# Patient Record
Sex: Male | Born: 2000 | Race: Black or African American | Hispanic: No | Marital: Single | Smoking: Never smoker
Health system: Southern US, Community
[De-identification: ages and names within clinical notes are randomized; demographics above are authoritative.]

## PROBLEM LIST (undated history)

## (undated) DIAGNOSIS — L509 Urticaria, unspecified: Secondary | ICD-10-CM

## (undated) DIAGNOSIS — J45909 Unspecified asthma, uncomplicated: Secondary | ICD-10-CM

## (undated) DIAGNOSIS — R51 Headache: Secondary | ICD-10-CM

## (undated) DIAGNOSIS — R569 Unspecified convulsions: Secondary | ICD-10-CM

## (undated) HISTORY — PX: CIRCUMCISION: SUR203

## (undated) HISTORY — DX: Urticaria, unspecified: L50.9

## (undated) HISTORY — DX: Unspecified convulsions: R56.9

## (undated) HISTORY — DX: Headache: R51

## (undated) HISTORY — DX: Unspecified asthma, uncomplicated: J45.909

---
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
## 2020-01-02 ENCOUNTER — Telehealth: Payer: Self-pay | Admitting: *Deleted

## 2020-01-02 NOTE — Telephone Encounter (Signed)
Spoke to mother and advised that patient has one dose on hand but will need MD appt for reappoval for Xolair after that

## 2020-03-11 ENCOUNTER — Telehealth: Payer: Self-pay | Admitting: *Deleted

## 2020-03-11 NOTE — Telephone Encounter (Signed)
Joe Obrien called wanting to get his xolair shot, we do not have it in the clinic, it will need to be ordered. Tammy please advise.

## 2020-03-12 NOTE — Telephone Encounter (Signed)
Spoke with mother and he was scheduled for an office visit with Thurston Hole.

## 2020-03-12 NOTE — Telephone Encounter (Signed)
Patient is inactive last injection July 2021 and has not been seen in clinic since Sept 2020.  Needs MD appt to restart therapy

## 2020-03-13 ENCOUNTER — Ambulatory Visit: Payer: Medicaid Other | Admitting: Family Medicine

## 2020-03-13 ENCOUNTER — Other Ambulatory Visit: Payer: Self-pay

## 2020-03-13 ENCOUNTER — Ambulatory Visit (INDEPENDENT_AMBULATORY_CARE_PROVIDER_SITE_OTHER): Payer: Medicaid Other | Admitting: *Deleted

## 2020-03-13 ENCOUNTER — Encounter: Payer: Self-pay | Admitting: Family Medicine

## 2020-03-13 VITALS — BP 94/70 | HR 76 | Temp 99.0°F | Resp 16 | Ht 72.0 in | Wt 131.6 lb

## 2020-03-13 DIAGNOSIS — H1013 Acute atopic conjunctivitis, bilateral: Secondary | ICD-10-CM

## 2020-03-13 DIAGNOSIS — L501 Idiopathic urticaria: Secondary | ICD-10-CM

## 2020-03-13 DIAGNOSIS — J309 Allergic rhinitis, unspecified: Secondary | ICD-10-CM

## 2020-03-13 DIAGNOSIS — J4540 Moderate persistent asthma, uncomplicated: Secondary | ICD-10-CM | POA: Diagnosis not present

## 2020-03-13 MED ORDER — FLUTICASONE PROPIONATE 50 MCG/ACT NA SUSP
2.0000 | Freq: Every day | NASAL | 5 refills | Status: DC | PRN
Start: 2020-03-13 — End: 2021-02-18

## 2020-03-13 MED ORDER — EPINEPHRINE 0.3 MG/0.3ML IJ SOAJ
INTRAMUSCULAR | 1 refills | Status: DC
Start: 2020-03-13 — End: 2021-02-18

## 2020-03-13 MED ORDER — ALBUTEROL SULFATE HFA 108 (90 BASE) MCG/ACT IN AERS: 2.0000 | INHALATION_SPRAY | RESPIRATORY_TRACT | 2 refills | Status: DC | PRN

## 2020-03-13 MED ORDER — FEXOFENADINE HCL 180 MG PO TABS
180.0000 mg | ORAL_TABLET | Freq: Every day | ORAL | 5 refills | Status: DC
Start: 2020-03-13 — End: 2022-10-18

## 2020-03-13 MED ORDER — OLOPATADINE HCL 0.2 % OP SOLN: 1.0000 [drp] | Freq: Every day | OPHTHALMIC | 5 refills | Status: AC | PRN

## 2020-03-13 MED ORDER — FLOVENT HFA 110 MCG/ACT IN AERO: 2.0000 | INHALATION_SPRAY | Freq: Two times a day (BID) | RESPIRATORY_TRACT | 5 refills | Status: DC

## 2020-03-13 NOTE — Progress Notes (Signed)
[address] [place] [address] Dept: [phone]  FOLLOW UP NOTE  Patient ID: Joe Obrien, male    DOB: 09/26/2000  Age: 19 y.o. MRN: [id number] Date of Office Visit: 03/13/2020  Assessment  Chief Complaint: Asthma  HPI Joe Obrien is a 19 year old male who presents to the clinic for follow-up visit.  He was last seen in this clinic on 12/26/2018 for evaluation of asthma, allergic rhinitis, allergic conjunctivitis, and chronic idiopathic urticaria.  In the interim he reports that he had Covid infection in September.  At today's visit he reports his asthma has been well controlled with no shortness of breath, cough, or wheeze with activity or rest.  He reports that he last used his albuterol 1 time about 3 months ago.  Allergic rhinitis is reported as well controlled with occasional nasal congestion.  He is not using an antihistamine or nasal steroid spray at this time.  Allergic conjunctivitis is reported as not well controlled with itchy, red eyes for which she is not currently using any medical intervention.  Chronic urticaria is reported as poorly controlled with itch and hives occurring daily.  He had his last Xolair injection in July.  He reports that while he was taking Xolair on a regular basis his chronic urticaria was well controlled.  His current medications are listed in the chart.   Drug Allergies:  Allergies  Allergen Reactions  . Amoxicillin Hives    Physical Exam: BP 94/70   Pulse 76   Temp 99 F (37.2 C) (Tympanic)   Resp 16   Ht 6' (1.829 m)   Wt 131 lb 9.6 oz (59.7 kg)   SpO2 98%   BMI 17.85 kg/m    Physical Exam Vitals reviewed.  Constitutional:      Appearance: Normal appearance.  HENT:     Head: Normocephalic and atraumatic.     Right Ear: Tympanic membrane normal.     Left Ear: Tympanic membrane normal.     Nose:     Comments: Bilateral nares edematous and pale with clear nasal drainage noted.  Pharynx normal.  Ears normal.  Eyes normal.     Mouth/Throat:     Pharynx: Oropharynx is clear.  Eyes:     Conjunctiva/sclera: Conjunctivae normal.  Cardiovascular:     Rate and Rhythm: Normal rate and regular rhythm.     Heart sounds: Normal heart sounds. No murmur heard.   Pulmonary:     Effort: Pulmonary effort is normal.     Breath sounds: Normal breath sounds.     Comments: Bilateral expiratory wheeze which cleared post bronchodilator therapy Musculoskeletal:        General: Normal range of motion.     Cervical back: Normal range of motion and neck supple.  Skin:    General: Skin is warm and dry.  Neurological:     Mental Status: He is alert and oriented to person, place, and time.  Psychiatric:        Mood and Affect: Mood normal.        Behavior: Behavior normal.        Thought Content: Thought content normal.        Judgment: Judgment normal.     Diagnostics: FVC 4.82, FEV1 4.58.  Predicted FVC 4.91, predicted FEV1 4.19.  Spirometry indicates normal ventilatory function.  Postbronchodilator FVC 5.17, FEV1 4.93.  Post bronchodilator therapy spirometry indicates normal ventilatory function with 8% improvement in FEV1.  Assessment and Plan: 1. Moderate persistent asthma, unspecified  whether complicated   2. Idiopathic urticaria   3. Allergic rhinitis, unspecified seasonality, unspecified trigger   4. Allergic conjunctivitis of both eyes     Meds ordered this encounter  Medications  . albuterol (PROAIR HFA) 108 (90 Base) MCG/ACT inhaler    Sig: Inhale 2 puffs into the lungs every 4 (four) hours as needed for wheezing or shortness of breath.    Dispense:  18 g    Refill:  2    One for home and one for school  . fluticasone (FLOVENT HFA) 110 MCG/ACT inhaler    Sig: Inhale 2 puffs into the lungs 2 (two) times daily.    Dispense:  1 each    Refill:  5  . fexofenadine (ALLEGRA) 180 MG tablet    Sig: Take 1 tablet (180 mg total) by mouth daily.    Dispense:  30 tablet    Refill:  5  . fluticasone (FLONASE) 50  MCG/ACT nasal spray    Sig: Place 2 sprays into both nostrils daily as needed.    Dispense:  16 g    Refill:  5  . Olopatadine HCl (PATADAY) 0.2 % SOLN    Sig: Place 1 drop into both eyes daily as needed.    Dispense:  2.5 mL    Refill:  5    Patient Instructions  Asthma Continue ProAir 2 puffs every 4 hours as needed for cough or wheeze.  You may use ProAir 2 puffs 5-15 minutes before exercise to decrease cough or wheeze. For now and for asthma flare, begin Flovent 110-2 puffs twice a day for 2 weeks or until cough and wheeze free  Allergic rhinitis Continue Allegra 180 mg OR cetirizine 10 mg once a day as needed for a runny nose or itch Continue Flonase 1-2 sprays in each nostril once a day as needed for a stuffy nose Continue appropriate allergen avoidance measures Consider saline nasal rinses as needed for nasal symptoms. Use this before any medicated nasal sprays for best result  Allergic conjunctivitis Some over the counter eye drops include Pataday one drop in each eye once a day as needed for red, itchy eyes OR Zaditor one drop in each eye twice a day as needed for red itchy eyes.  Idiopathic urticaria Restart Xolair injections once every 28 days and have access to an epinephrine auto-injector Begin Allegra 180 mg once a day as needed for itch or hives If your symptoms re-occur, begin a journal of events that occurred for up to 6 hours before your symptoms began including foods and beverages consumed, soaps or perfumes you had contact with, and medications.   Continue the other medications as listed in your chart  Follow up in 6 months or sooner if needed    Return in about 6 months (around 09/10/2020), or if symptoms worsen or fail to improve.    Thank you for the opportunity to care for this patient.  Please do not hesitate to contact me with questions.  Thermon Leyland, FNP [facility]

## 2020-03-13 NOTE — Patient Instructions (Addendum)
Asthma Continue ProAir 2 puffs every 4 hours as needed for cough or wheeze.  You may use ProAir 2 puffs 5-15 minutes before exercise to decrease cough or wheeze. For now and for asthma flare, begin Flovent 110-2 puffs twice a day for 2 weeks or until cough and wheeze free  Allergic rhinitis Continue Allegra 180 mg OR cetirizine 10 mg once a day as needed for a runny nose or itch Continue Flonase 1-2 sprays in each nostril once a day as needed for a stuffy nose Continue appropriate allergen avoidance measures Consider saline nasal rinses as needed for nasal symptoms. Use this before any medicated nasal sprays for best result  Allergic conjunctivitis Some over the counter eye drops include Pataday one drop in each eye once a day as needed for red, itchy eyes OR Zaditor one drop in each eye twice a day as needed for red itchy eyes.  Idiopathic urticaria Restart Xolair injections once every 28 days and have access to an epinephrine auto-injector Begin Allegra 180 mg once a day as needed for itch or hives If your symptoms re-occur, begin a journal of events that occurred for up to 6 hours before your symptoms began including foods and beverages consumed, soaps or perfumes you had contact with, and medications.   Continue the other medications as listed in your chart  Follow up in 6 months or sooner if needed

## 2020-03-24 ENCOUNTER — Other Ambulatory Visit: Payer: Self-pay | Admitting: *Deleted

## 2020-03-24 MED ORDER — XOLAIR 150 MG ~~LOC~~ SOLR
SUBCUTANEOUS | 11 refills | Status: DC
Start: 2020-03-24 — End: 2021-02-25

## 2020-04-10 ENCOUNTER — Ambulatory Visit (INDEPENDENT_AMBULATORY_CARE_PROVIDER_SITE_OTHER): Payer: Medicaid Other | Admitting: *Deleted

## 2020-04-10 ENCOUNTER — Other Ambulatory Visit: Payer: Self-pay

## 2020-04-10 DIAGNOSIS — J4540 Moderate persistent asthma, uncomplicated: Secondary | ICD-10-CM

## 2020-05-08 ENCOUNTER — Ambulatory Visit: Payer: Self-pay

## 2020-05-13 ENCOUNTER — Ambulatory Visit (INDEPENDENT_AMBULATORY_CARE_PROVIDER_SITE_OTHER): Payer: Medicaid Other

## 2020-05-13 ENCOUNTER — Other Ambulatory Visit: Payer: Self-pay

## 2020-05-13 DIAGNOSIS — J4540 Moderate persistent asthma, uncomplicated: Secondary | ICD-10-CM | POA: Diagnosis not present

## 2020-06-10 ENCOUNTER — Ambulatory Visit (INDEPENDENT_AMBULATORY_CARE_PROVIDER_SITE_OTHER): Payer: Medicaid Other

## 2020-06-10 ENCOUNTER — Other Ambulatory Visit: Payer: Self-pay

## 2020-06-10 DIAGNOSIS — L500 Allergic urticaria: Secondary | ICD-10-CM | POA: Diagnosis not present

## 2020-07-08 ENCOUNTER — Ambulatory Visit: Payer: Self-pay

## 2020-07-09 ENCOUNTER — Ambulatory Visit: Payer: Self-pay

## 2020-08-04 ENCOUNTER — Ambulatory Visit (INDEPENDENT_AMBULATORY_CARE_PROVIDER_SITE_OTHER): Payer: Medicaid Other

## 2020-08-04 ENCOUNTER — Other Ambulatory Visit: Payer: Self-pay

## 2020-08-04 DIAGNOSIS — L500 Allergic urticaria: Secondary | ICD-10-CM | POA: Diagnosis not present

## 2020-09-01 ENCOUNTER — Other Ambulatory Visit: Payer: Self-pay

## 2020-09-01 ENCOUNTER — Ambulatory Visit (INDEPENDENT_AMBULATORY_CARE_PROVIDER_SITE_OTHER): Payer: Medicaid Other

## 2020-09-01 DIAGNOSIS — L500 Allergic urticaria: Secondary | ICD-10-CM | POA: Diagnosis not present

## 2020-09-29 ENCOUNTER — Ambulatory Visit: Payer: Medicaid Other

## 2020-09-29 ENCOUNTER — Other Ambulatory Visit: Payer: Self-pay

## 2020-11-13 ENCOUNTER — Ambulatory Visit: Payer: Medicaid Other

## 2020-11-16 ENCOUNTER — Other Ambulatory Visit: Payer: Self-pay | Admitting: *Deleted

## 2020-11-18 ENCOUNTER — Telehealth: Payer: Self-pay | Admitting: *Deleted

## 2020-11-18 NOTE — Telephone Encounter (Signed)
Patient had called to come in and nos howed for Xolair so it had to be thrown out. Mom called wanting to know when he can get shipped and come in for injection. I L/m for her advising 8/2 delivery date and can call office to schedule anytime after 8/2

## 2020-11-24 ENCOUNTER — Other Ambulatory Visit: Payer: Self-pay

## 2020-11-24 ENCOUNTER — Ambulatory Visit (INDEPENDENT_AMBULATORY_CARE_PROVIDER_SITE_OTHER): Payer: Medicaid Other

## 2020-11-24 DIAGNOSIS — L500 Allergic urticaria: Secondary | ICD-10-CM

## 2020-12-22 ENCOUNTER — Other Ambulatory Visit: Payer: Self-pay

## 2020-12-22 ENCOUNTER — Ambulatory Visit (INDEPENDENT_AMBULATORY_CARE_PROVIDER_SITE_OTHER): Payer: Medicaid Other

## 2020-12-22 DIAGNOSIS — L500 Allergic urticaria: Secondary | ICD-10-CM | POA: Diagnosis not present

## 2021-01-19 ENCOUNTER — Other Ambulatory Visit: Payer: Self-pay

## 2021-01-19 ENCOUNTER — Ambulatory Visit (INDEPENDENT_AMBULATORY_CARE_PROVIDER_SITE_OTHER): Payer: Medicaid Other

## 2021-01-19 DIAGNOSIS — L500 Allergic urticaria: Secondary | ICD-10-CM | POA: Diagnosis not present

## 2021-02-08 ENCOUNTER — Telehealth: Payer: Self-pay | Admitting: *Deleted

## 2021-02-08 NOTE — Telephone Encounter (Signed)
L/m for patient or mother that patient needs md appt for Xolair reapproval. Contact clinic

## 2021-02-16 NOTE — Patient Instructions (Addendum)
Asthma Continue ProAir 2 puffs every 4 hours as needed for cough or wheeze.  You may use ProAir 2 puffs 5-15 minutes before exercise to decrease cough or wheeze. For asthma flares/upper respiratory tract infections, begin Flovent 110-2 puffs twice a day for 2 weeks or until cough and wheeze free  Allergic rhinitis May use Allegra 180 mg OR cetirizine 10 mg once a day as needed for a runny nose or itch May use Flonase (fluticasone)1-2 sprays in each nostril once a day as needed for a stuffy nose Continue appropriate allergen avoidance measures Consider saline nasal rinses as needed for nasal symptoms. Use this before any medicated nasal sprays for best result  Allergic conjunctivitis Some over the counter eye drops include Pataday one drop in each eye once a day as needed for red, itchy eyes OR Zaditor one drop in each eye twice a day as needed for red itchy eyes.  Idiopathic urticaria Continue Xolair injections 300 mg once every 28 days and have access to an epinephrine auto-injector May use Allegra 180 mg once a day as needed for itch or hives If your symptoms re-occur, begin a journal of events that occurred for up to 6 hours before your symptoms began including foods and beverages consumed, soaps or perfumes you had contact with, and medications.   Follow up in 6 months or sooner if needed

## 2021-02-18 ENCOUNTER — Other Ambulatory Visit: Payer: Self-pay

## 2021-02-18 ENCOUNTER — Ambulatory Visit (INDEPENDENT_AMBULATORY_CARE_PROVIDER_SITE_OTHER): Payer: Medicaid Other

## 2021-02-18 ENCOUNTER — Encounter: Payer: Self-pay | Admitting: Family

## 2021-02-18 ENCOUNTER — Ambulatory Visit: Payer: Medicaid Other | Admitting: Family

## 2021-02-18 VITALS — BP 90/60 | HR 62 | Temp 98.4°F | Resp 16 | Ht 71.65 in | Wt 133.0 lb

## 2021-02-18 DIAGNOSIS — H1013 Acute atopic conjunctivitis, bilateral: Secondary | ICD-10-CM

## 2021-02-18 DIAGNOSIS — J4540 Moderate persistent asthma, uncomplicated: Secondary | ICD-10-CM

## 2021-02-18 DIAGNOSIS — L501 Idiopathic urticaria: Secondary | ICD-10-CM

## 2021-02-18 DIAGNOSIS — J309 Allergic rhinitis, unspecified: Secondary | ICD-10-CM

## 2021-02-18 MED ORDER — ALBUTEROL SULFATE HFA 108 (90 BASE) MCG/ACT IN AERS: 2.0000 | INHALATION_SPRAY | RESPIRATORY_TRACT | 1 refills | Status: AC | PRN

## 2021-02-18 MED ORDER — EPINEPHRINE 0.3 MG/0.3ML IJ SOAJ
INTRAMUSCULAR | 1 refills | Status: DC
Start: 2021-02-18 — End: 2022-10-18

## 2021-02-18 MED ORDER — FLUTICASONE PROPIONATE 50 MCG/ACT NA SUSP
2.0000 | Freq: Every day | NASAL | 5 refills | Status: DC | PRN
Start: 2021-02-18 — End: 2022-10-18

## 2021-02-18 MED ORDER — FLUTICASONE PROPIONATE HFA 110 MCG/ACT IN AERO
INHALATION_SPRAY | RESPIRATORY_TRACT | 3 refills | Status: DC
Start: 2021-02-18 — End: 2022-10-18

## 2021-02-18 NOTE — Progress Notes (Signed)
[address] [place] [address] Dept: [phone]  FOLLOW UP NOTE  Patient ID: Joe Obrien, male    DOB: 26-Mar-2001  Age: 20 y.o. MRN: [id number] Date of Office Visit: 02/18/2021  Assessment  Chief Complaint: Asthma  HPI Joe Obrien is a 20 year old male who presents today for follow-up of moderate persistent asthma, idiopathic urticaria, allergic rhinitis and allergic conjunctivitis.  He was last seen on March 13, 2020 by Thermon Leyland, FNP.  Moderate persistent asthma is reported as controlled with albuterol as needed and Flovent 110 mcg  2 puffs twice a day for asthma flares.  He denies any coughing, wheezing, tightness in his chest, shortness of breath, and nocturnal awakenings due to breathing problems.  Since his last office visit he has not required any systemic steroids or made any trips to the emergency room or urgent care due to breathing problems.  He has not had to use his albuterol in the past 3 months. He has not had to use Flovent 110 mcg  for asthma flares since we last saw him.  Idiopathic urticaria is reported as doing better as long as he is on Xolair 300 mg every 4 weeks.  He is currently not taking any over-the-counter antihistamines.  He reports as long as he takes his Xolair injections he does not really have any hives.  He mentions that if he gets hit by a ball or scratched that he will have an area showup that is rough.  He mentions prior to starting Xolair he would have itching and swelling at the site.  Seasonal and perennial allergic rhinitis is reported as controlled with no medication at this time he denies rhinorrhea, nasal congestion, and postnasal drip.  He has not had any sinus infections since we last saw him.  Allergic conjunctivitis is reported as controlled with no medications at this time.  He denies itchy watery eyes.   Drug Allergies:  Allergies  Allergen Reactions   Amoxicillin Hives    Review of Systems: Review of Systems   Constitutional:  Negative for chills and fever.  HENT:         Denies rhinorrhea, nasal congestion, and post nasal drip  Eyes:        Denies itchy watery eyes  Respiratory:  Negative for cough, shortness of breath and wheezing.   Cardiovascular:  Negative for chest pain and palpitations.  Gastrointestinal:        Denies heartburn and reflux  Genitourinary:  Negative for frequency.  Skin:  Positive for itching and rash.       Reports rash or itching if he gets hit by a ball or scratched  Neurological:  Negative for headaches.  Endo/Heme/Allergies:  Positive for environmental allergies.    Physical Exam: BP 90/60 (BP Location: Right Arm, Patient Position: Sitting, Cuff Size: Normal)   Pulse 62   Temp 98.4 F (36.9 C) (Temporal)   Resp 16   Ht 5' 11.65" (1.82 m)   Wt 133 lb (60.3 kg)   SpO2 98%   BMI 18.21 kg/m    Physical Exam Constitutional:      Appearance: Normal appearance.  HENT:     Head: Normocephalic and atraumatic.     Comments: Pharynx normal. Eyes normal. Ears: unable to visualize right tympanic membrane due to cerumen. Left ear normal.Nose: bilateral lower turbinates moderately edematous and slightly erythematous with clear drainage noted    Right Ear: Ear canal and external ear normal.     Left Ear:  Tympanic membrane, ear canal and external ear normal.     Mouth/Throat:     Mouth: Mucous membranes are moist.     Pharynx: Oropharynx is clear.  Eyes:     Conjunctiva/sclera: Conjunctivae normal.  Cardiovascular:     Rate and Rhythm: Regular rhythm.     Heart sounds: Normal heart sounds.  Pulmonary:     Effort: Pulmonary effort is normal.     Breath sounds: Normal breath sounds.     Comments: Lungs clear to auscultation Musculoskeletal:     Cervical back: Neck supple.  Skin:    General: Skin is warm.  Neurological:     Mental Status: He is alert and oriented to person, place, and time.  Psychiatric:        Mood and Affect: Mood normal.        Behavior:  Behavior normal.        Thought Content: Thought content normal.        Judgment: Judgment normal.    Diagnostics: FVC 4.40 L, FEV1 4.23 L.  Predicted FVC 4.96 L, predicted FEV1 4.23 L.  Spirometry indicates normal respiratory function.    Assessment and Plan: 1. Moderate persistent asthma, unspecified whether complicated   2. Idiopathic urticaria   3. Allergic rhinitis, unspecified seasonality, unspecified trigger   4. Allergic conjunctivitis of both eyes     Meds ordered this encounter  Medications   EPINEPHrine 0.3 mg/0.3 mL IJ SOAJ injection    Sig: Use as directed for severe allergic reactions    Dispense:  2 each    Refill:  1    Dispense mylan or teva generic brand only. Should not need a PA   albuterol (PROAIR HFA) 108 (90 Base) MCG/ACT inhaler    Sig: Inhale 2 puffs into the lungs every 4 (four) hours as needed for wheezing or shortness of breath.    Dispense:  18 g    Refill:  1   fluticasone (FLONASE) 50 MCG/ACT nasal spray    Sig: Place 2 sprays into both nostrils daily as needed.    Dispense:  16 g    Refill:  5   fluticasone (FLOVENT HFA) 110 MCG/ACT inhaler    Sig: At the first sign of asthma flares take 2 puffs twice daily with spacer until symptoms return to baseline    Dispense:  1 each    Refill:  3     Patient Instructions  Asthma Continue ProAir 2 puffs every 4 hours as needed for cough or wheeze.  You may use ProAir 2 puffs 5-15 minutes before exercise to decrease cough or wheeze. For asthma flares/upper respiratory tract infections, begin Flovent 110-2 puffs twice a day for 2 weeks or until cough and wheeze free  Allergic rhinitis May use Allegra 180 mg OR cetirizine 10 mg once a day as needed for a runny nose or itch May use Flonase (fluticasone)1-2 sprays in each nostril once a day as needed for a stuffy nose Continue appropriate allergen avoidance measures Consider saline nasal rinses as needed for nasal symptoms. Use this before any medicated  nasal sprays for best result  Allergic conjunctivitis Some over the counter eye drops include Pataday one drop in each eye once a day as needed for red, itchy eyes OR Zaditor one drop in each eye twice a day as needed for red itchy eyes.  Idiopathic urticaria Continue Xolair injections 300 mg once every 28 days and have access to an epinephrine auto-injector May use Allegra 180  mg once a day as needed for itch or hives If your symptoms re-occur, begin a journal of events that occurred for up to 6 hours before your symptoms began including foods and beverages consumed, soaps or perfumes you had contact with, and medications.   Follow up in 6 months or sooner if needed  Return in about 6 months (around 08/19/2021), or if symptoms worsen or fail to improve.    Thank you for the opportunity to care for this patient.  Please do not hesitate to contact me with questions.  Nehemiah Settle, FNP [facility]

## 2021-02-23 ENCOUNTER — Other Ambulatory Visit: Payer: Self-pay | Admitting: Family Medicine

## 2021-03-16 ENCOUNTER — Ambulatory Visit: Payer: Medicaid Other

## 2021-03-25 ENCOUNTER — Ambulatory Visit: Payer: Medicaid Other

## 2021-03-29 ENCOUNTER — Ambulatory Visit (INDEPENDENT_AMBULATORY_CARE_PROVIDER_SITE_OTHER): Payer: Medicaid Other

## 2021-03-29 ENCOUNTER — Other Ambulatory Visit: Payer: Self-pay

## 2021-03-29 DIAGNOSIS — J4540 Moderate persistent asthma, uncomplicated: Secondary | ICD-10-CM

## 2021-04-28 ENCOUNTER — Ambulatory Visit: Payer: Medicaid Other

## 2021-04-29 ENCOUNTER — Ambulatory Visit (INDEPENDENT_AMBULATORY_CARE_PROVIDER_SITE_OTHER): Payer: Medicaid Other

## 2021-04-29 DIAGNOSIS — L500 Allergic urticaria: Secondary | ICD-10-CM | POA: Diagnosis not present

## 2021-05-27 ENCOUNTER — Other Ambulatory Visit: Payer: Self-pay

## 2021-05-27 ENCOUNTER — Ambulatory Visit (INDEPENDENT_AMBULATORY_CARE_PROVIDER_SITE_OTHER): Payer: Medicaid Other

## 2021-05-27 DIAGNOSIS — L500 Allergic urticaria: Secondary | ICD-10-CM | POA: Diagnosis not present

## 2021-06-24 ENCOUNTER — Other Ambulatory Visit: Payer: Self-pay

## 2021-06-24 ENCOUNTER — Ambulatory Visit (INDEPENDENT_AMBULATORY_CARE_PROVIDER_SITE_OTHER): Payer: Medicaid Other

## 2021-06-24 DIAGNOSIS — L500 Allergic urticaria: Secondary | ICD-10-CM | POA: Diagnosis not present

## 2021-07-22 ENCOUNTER — Ambulatory Visit: Payer: Medicaid Other

## 2021-08-17 ENCOUNTER — Ambulatory Visit (INDEPENDENT_AMBULATORY_CARE_PROVIDER_SITE_OTHER): Payer: Medicaid Other

## 2021-08-17 DIAGNOSIS — L500 Allergic urticaria: Secondary | ICD-10-CM

## 2021-09-14 ENCOUNTER — Ambulatory Visit: Payer: Medicaid Other

## 2021-09-17 ENCOUNTER — Ambulatory Visit (INDEPENDENT_AMBULATORY_CARE_PROVIDER_SITE_OTHER): Payer: Medicaid Other

## 2021-09-17 DIAGNOSIS — L500 Allergic urticaria: Secondary | ICD-10-CM | POA: Diagnosis not present

## 2021-10-13 ENCOUNTER — Emergency Department (HOSPITAL_BASED_OUTPATIENT_CLINIC_OR_DEPARTMENT_OTHER)
Admission: EM | Admit: 2021-10-13 | Discharge: 2021-10-13 | Disposition: A | Discharge status: Home / Self Care | Payer: Medicaid Other | Attending: Emergency Medicine | Admitting: Emergency Medicine

## 2021-10-13 ENCOUNTER — Encounter (HOSPITAL_BASED_OUTPATIENT_CLINIC_OR_DEPARTMENT_OTHER): Payer: Self-pay | Admitting: Emergency Medicine

## 2021-10-13 ENCOUNTER — Emergency Department (HOSPITAL_BASED_OUTPATIENT_CLINIC_OR_DEPARTMENT_OTHER): Payer: Medicaid Other

## 2021-10-13 DIAGNOSIS — Y92410 Unspecified street and highway as the place of occurrence of the external cause: Secondary | ICD-10-CM | POA: Diagnosis not present

## 2021-10-13 DIAGNOSIS — R0789 Other chest pain: Secondary | ICD-10-CM | POA: Insufficient documentation
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
## 2021-10-13 NOTE — Discharge Instructions (Addendum)
Take Tylenol and Motrin for pain.  Follow-up with the primary, information provided if we do not have 1.  Return to the ED if you start having new or concerning symptoms.

## 2021-10-15 ENCOUNTER — Ambulatory Visit: Payer: Medicaid Other

## 2021-12-28 ENCOUNTER — Ambulatory Visit (INDEPENDENT_AMBULATORY_CARE_PROVIDER_SITE_OTHER): Payer: Medicaid Other | Admitting: *Deleted

## 2021-12-28 DIAGNOSIS — L501 Idiopathic urticaria: Secondary | ICD-10-CM | POA: Diagnosis not present

## 2022-01-25 ENCOUNTER — Ambulatory Visit: Payer: Medicaid Other

## 2022-02-14 ENCOUNTER — Other Ambulatory Visit: Payer: Self-pay | Admitting: Family Medicine

## 2022-03-01 ENCOUNTER — Ambulatory Visit (INDEPENDENT_AMBULATORY_CARE_PROVIDER_SITE_OTHER): Payer: Medicaid Other

## 2022-03-01 DIAGNOSIS — L501 Idiopathic urticaria: Secondary | ICD-10-CM | POA: Diagnosis not present

## 2022-03-28 ENCOUNTER — Ambulatory Visit (INDEPENDENT_AMBULATORY_CARE_PROVIDER_SITE_OTHER): Payer: Medicaid Other

## 2022-03-28 DIAGNOSIS — L501 Idiopathic urticaria: Secondary | ICD-10-CM

## 2022-04-26 ENCOUNTER — Ambulatory Visit: Payer: Medicaid Other

## 2022-10-05 ENCOUNTER — Telehealth: Payer: Self-pay

## 2022-10-05 NOTE — Telephone Encounter (Signed)
Patient called to see if he could make an appointment to get his Xolair injection. Patient was advised that there were no more injections in office. Per Tammy, patient needs to make an appointment with MD to restart. Called and left voicemail message letting him know to make an appointment.

## 2022-10-18 ENCOUNTER — Encounter: Payer: Self-pay | Admitting: Internal Medicine

## 2022-10-18 ENCOUNTER — Ambulatory Visit (INDEPENDENT_AMBULATORY_CARE_PROVIDER_SITE_OTHER): Payer: Medicaid Other | Admitting: Internal Medicine

## 2022-10-18 VITALS — BP 100/66 | HR 66 | Temp 98.2°F | Resp 16 | Ht 72.0 in | Wt 130.9 lb

## 2022-10-18 DIAGNOSIS — J3089 Other allergic rhinitis: Secondary | ICD-10-CM | POA: Diagnosis not present

## 2022-10-18 DIAGNOSIS — J4530 Mild persistent asthma, uncomplicated: Secondary | ICD-10-CM | POA: Diagnosis not present

## 2022-10-18 DIAGNOSIS — L501 Idiopathic urticaria: Secondary | ICD-10-CM | POA: Diagnosis not present

## 2022-10-18 DIAGNOSIS — H1045 Other chronic allergic conjunctivitis: Secondary | ICD-10-CM

## 2022-10-18 MED ORDER — FEXOFENADINE HCL 180 MG PO TABS: 180.0000 mg | ORAL_TABLET | Freq: Every day | ORAL | 5 refills | Status: AC

## 2022-10-18 MED ORDER — FLUTICASONE PROPIONATE HFA 110 MCG/ACT IN AERO: INHALATION_SPRAY | RESPIRATORY_TRACT | 3 refills | Status: AC

## 2022-10-18 MED ORDER — EPINEPHRINE 0.3 MG/0.3ML IJ SOAJ: INTRAMUSCULAR | 1 refills | Status: AC

## 2022-10-18 MED ORDER — FLUTICASONE PROPIONATE 50 MCG/ACT NA SUSP: 2.0000 | Freq: Every day | NASAL | 5 refills | Status: AC | PRN

## 2022-10-18 MED ORDER — OMALIZUMAB 150 MG/ML ~~LOC~~ SOSY
300.0000 mg | PREFILLED_SYRINGE | Freq: Once | SUBCUTANEOUS | Status: DC
  Administered 2022-10-18: 300 mg via SUBCUTANEOUS

## 2022-10-18 NOTE — Patient Instructions (Addendum)
Asthma: well controlled  Breathing test looked great!  Continue ProAir 2 puffs every 4 hours as needed for cough or wheeze.  You may use ProAir 2 puffs 5-15 minutes before exercise to decrease cough or wheeze. For asthma flares/upper respiratory tract infections, begin Flovent 110-2 puffs twice a day for 2 weeks or until cough and wheeze free  Allergic rhinitis May use Allegra 180 mg OR cetirizine 10 mg once a day as needed for a runny nose or itch May use Flonase (fluticasone)1-2 sprays in each nostril once a day as needed for a stuffy nose Continue appropriate allergen avoidance measures Consider saline nasal rinses as needed for nasal symptoms. Use this before any medicated nasal sprays for best result  Allergic conjunctivitis Some over the counter eye drops include Pataday one drop in each eye once a day as needed for red, itchy eyes OR Zaditor one drop in each eye twice a day as needed for red itchy eyes. Pataday samples given today   Idiopathic urticaria Restart Xolair injections 300 mg once every 28 days and have access to an epinephrine auto-injector  -Sample of 300mg  given today  Start allegra 180mg  or Cetirizine 10mg  daily for hives  If your symptoms re-occur, begin a journal of events that occurred for up to 6 hours before your symptoms began including foods and beverages consumed, soaps or perfumes you had contact with, and medications.   Follow up in 6 months or sooner if needed

## 2022-10-18 NOTE — Progress Notes (Signed)
Follow Up Note  RE: Joe Obrien MRN: [id number] DOB: [birth year] Date of Office Visit: 10/18/2022  Referring provider: No ref. provider found Primary care provider: Pcp, No  Chief Complaint: Follow-up (Pt states he present to restart his Xolair, and he is in activity hives.) and Urticaria  History of Present Illness: I had the pleasure of seeing Joe Obrien for a follow up visit at the [facility] on 10/18/2022. He is a 22 y.o. male, who is being followed for idiopathic urticaria, persistent asthma, allergic rhinitis . His previous allergy office visit was on 02/18/21 with Joe Settle FNP. Today is a  follow up visit to restart xolair  .  History obtained from patient, chart review .  He was previously on xolair 300mg  every 4 weeks for urticaria with good control of symptoms. Last injection 03/28/22.  Denies any adverse effects.  He went to [facility] on vacation and forgot to schedule follow up.  Since inadvertently  stopped xolair he had a recurrent on his urticaria despite taking allegra 180mg  1-2 time a day.  He would like to restart Xolair.  Asthma is well-controlled.  He denies any SABA use since last visit. He has flovent for add on, but has not needed it.  No ABX/OCS since last visit.   Nasal and ocular symptoms are well controlled with allegra.  He has not needed any of his flonase.  Has had increaed ocular itching and swelling over the past 24 hours.      Assessment and Plan: Joe Obrien is a 22 y.o. male with: Idiopathic urticaria  Other allergic rhinitis  Other chronic allergic conjunctivitis of both eyes  Mild persistent asthma without complication - Plan: Spirometry with Graph   Plan: Patient Instructions  Asthma: well controlled  Breathing test looked great!  Continue ProAir 2 puffs every 4 hours as needed for cough or wheeze.  You may use ProAir 2 puffs 5-15 minutes before exercise to decrease cough or wheeze. For asthma flares/upper respiratory tract  infections, begin Flovent 110-2 puffs twice a day for 2 weeks or until cough and wheeze free  Allergic rhinitis May use Allegra 180 mg OR cetirizine 10 mg once a day as needed for a runny nose or itch May use Flonase (fluticasone)1-2 sprays in each nostril once a day as needed for a stuffy nose Continue appropriate allergen avoidance measures Consider saline nasal rinses as needed for nasal symptoms. Use this before any medicated nasal sprays for best result  Allergic conjunctivitis Some over the counter eye drops include Pataday one drop in each eye once a day as needed for red, itchy eyes OR Zaditor one drop in each eye twice a day as needed for red itchy eyes. Pataday samples given today   Idiopathic urticaria Restart Xolair injections 300 mg once every 28 days and have access to an epinephrine auto-injector  -Sample of 300mg  given today  Start allegra 180mg  or Cetirizine 10mg  daily for hives  If your symptoms re-occur, begin a journal of events that occurred for up to 6 hours before your symptoms began including foods and beverages consumed, soaps or perfumes you had contact with, and medications.   Follow up in 6 months or sooner if needed   Meds ordered this encounter  Medications   omalizumab Joe Obrien) prefilled syringe 300 mg   EPINEPHrine 0.3 mg/0.3 mL IJ SOAJ injection    Sig: Use as directed for severe allergic reactions    Dispense:  2 each  Refill:  1    Dispense mylan or teva generic brand only. Should not need a PA   fluticasone (FLOVENT HFA) 110 MCG/ACT inhaler    Sig: At the first sign of asthma flares take 2 puffs twice daily with spacer until symptoms return to baseline    Dispense:  1 each    Refill:  3   fluticasone (FLONASE) 50 MCG/ACT nasal spray    Sig: Place 2 sprays into both nostrils daily as needed.    Dispense:  16 g    Refill:  5   fexofenadine (ALLEGRA) 180 MG tablet    Sig: Take 1 tablet (180 mg total) by mouth daily.    Dispense:  30 tablet     Refill:  5    Lab Orders  No laboratory test(s) ordered today   Diagnostics: Spirometry:  Tracings reviewed. His effort: Good reproducible efforts. FVC: 4.58 L FEV1: 4.30 L, 101% predicted FEV1/FVC ratio: 94% Interpretation: Spirometry consistent with normal pattern.  Please see scanned spirometry results for details.   Results interpreted by myself during this encounter and discussed with patient/family.   Medication List:  Current Outpatient Medications  Medication Sig Dispense Refill   acetaminophen (TYLENOL) 500 MG tablet Take by mouth as needed.      albuterol (PROAIR HFA) 108 (90 Base) MCG/ACT inhaler Inhale 2 puffs into the lungs every 4 (four) hours as needed for wheezing or shortness of breath. 18 g 1   diphenhydrAMINE (BENADRYL) 25 mg capsule Take 25 mg by mouth.     phenylephrine (SUDAFED PE) 10 MG TABS tablet Take by mouth.     XOLAIR 150 MG injection INJECT 300 MG UNDER THE SKIN EVERY 4 WEEKS 2 each 11   EPINEPHrine 0.3 mg/0.3 mL IJ SOAJ injection Use as directed for severe allergic reactions 2 each 1   fexofenadine (ALLEGRA) 180 MG tablet Take 1 tablet (180 mg total) by mouth daily. 30 tablet 5   fluticasone (FLONASE) 50 MCG/ACT nasal spray Place 2 sprays into both nostrils daily as needed. 16 g 5   fluticasone (FLOVENT HFA) 110 MCG/ACT inhaler At the first sign of asthma flares take 2 puffs twice daily with spacer until symptoms return to baseline 1 each 3   Olopatadine HCl (PATADAY) 0.2 % SOLN Place 1 drop into both eyes daily as needed. (Patient not taking: Reported on 10/18/2022) 2.5 mL 5   Current Facility-Administered Medications  Medication Dose Route Frequency Provider Last Rate Last Admin   omalizumab Joe Obrien) injection 300 mg  300 mg Subcutaneous Q28 days Bobbitt, Heywood Iles, MD   300 mg at 03/28/22 1031   Allergies: Allergies  Allergen Reactions   Amoxicillin Hives   I reviewed his past medical history, social history, family history, and  environmental history and no significant changes have been reported from his previous visit.  ROS: All others negative except as noted per HPI.   Objective: BP 100/66   Pulse 66   Temp 98.2 F (36.8 C) (Temporal)   Resp 16   Ht 6' (1.829 m)   Wt 130 lb 14.4 oz (59.4 kg)   SpO2 99%   BMI 17.75 kg/m  Body mass index is 17.75 kg/m. General Appearance:  Alert, cooperative, no distress, appears stated age  Head:  Normocephalic, without obvious abnormality, atraumatic  Eyes:  EOM's intact, R>L eyelid swelling, mildy conjunctival erythema   Nose: Nares normal, normal mucosa, no visible anterior polyps, and septum midline  Throat: Lips, tongue normal; teeth and gums  normal, normal posterior oropharynx  Neck: Supple, symmetrical  Lungs:   clear to auscultation bilaterally, Respirations unlabored, no coughing  Heart:  regular rate and rhythm and no murmur, Appears well perfused  Extremities: No edema  Skin: Skin color, texture, turgor normal, no rashes or lesions on visualized portions of skin  Neurologic: No gross deficits   Previous notes and tests were reviewed. The plan was reviewed with the patient/family, and all questions/concerned were addressed.  It was my pleasure to see Malakye today and participate in his care. Please feel free to contact me with any questions or concerns.  Sincerely,  Ferol Luz, MD  Allergy & Immunology  [facility] Office: [phone]

## 2022-10-18 NOTE — Progress Notes (Signed)
Immunotherapy   Patient Details  Name: Joe Obrien MRN: [id number] Date of Birth: Aug 28, 2000  10/18/2022  [name] restarted Xolair today 300 mg sample was given in office.  Given for : Hives Frequency: Every 4 weeks Epi-Pen: Yes Consent signed and patient instructions given.   Modesto Charon 10/18/2022, 11:50 AM

## 2022-10-19 ENCOUNTER — Telehealth: Payer: Self-pay

## 2022-10-19 ENCOUNTER — Other Ambulatory Visit (HOSPITAL_COMMUNITY): Payer: Self-pay

## 2022-10-19 ENCOUNTER — Other Ambulatory Visit (HOSPITAL_BASED_OUTPATIENT_CLINIC_OR_DEPARTMENT_OTHER): Payer: Self-pay

## 2022-10-19 ENCOUNTER — Other Ambulatory Visit: Payer: Self-pay

## 2022-10-19 ENCOUNTER — Telehealth: Payer: Self-pay | Admitting: *Deleted

## 2022-10-19 MED ORDER — OMALIZUMAB 150 MG/ML ~~LOC~~ SOSY
300.0000 mg | PREFILLED_SYRINGE | SUBCUTANEOUS | 11 refills | Status: DC
  Filled 2022-10-19 – 2022-11-08 (×2): qty 2, 28d supply, fill #0
  Filled 2022-12-01: qty 2, 28d supply, fill #1
  Filled 2023-01-30: qty 2, 28d supply, fill #2
  Filled 2023-03-09: qty 2, 28d supply, fill #3

## 2022-10-19 MED ORDER — CETIRIZINE HCL 10 MG PO TABS: 10.0000 mg | ORAL_TABLET | Freq: Every day | ORAL | 5 refills | Status: AC

## 2022-10-19 NOTE — Telephone Encounter (Signed)
Sent in cetirizine tablet 10mg 

## 2022-10-19 NOTE — Telephone Encounter (Signed)
*  Asthma/Allergy  PA request received via fax for Fexofenadine 180mg    PA not submitted due to possible med change   Preferred alternatives:

## 2022-10-19 NOTE — Telephone Encounter (Signed)
He can do cetirizine 10mg  1 tab daily

## 2022-10-19 NOTE — Telephone Encounter (Signed)
-----   Message from Ferol Luz, MD sent at 10/18/2022 12:04 PM EDT ----- I saw Joe Obrien today for xolair 300mg  every 4 weeks restart for idiopathic urtcaria.  He's flared since stopping. Can we resubmit?

## 2022-10-19 NOTE — Telephone Encounter (Signed)
Pts insurance does like allegra prefers ceititrzine levocetirizine or loratadine advise to change and strength and directions

## 2022-10-19 NOTE — Telephone Encounter (Signed)
Called patient and advised approval and submit to [facility] for Xolair. Appt made for next inj in clinic

## 2022-10-20 ENCOUNTER — Other Ambulatory Visit: Payer: Self-pay

## 2022-10-20 ENCOUNTER — Other Ambulatory Visit (HOSPITAL_COMMUNITY): Payer: Self-pay

## 2022-10-20 MED ORDER — OMALIZUMAB 150 MG/ML ~~LOC~~ SOSY
300.0000 mg | PREFILLED_SYRINGE | SUBCUTANEOUS | Status: AC
  Administered 2022-10-18 – 2023-04-25 (×5): 300 mg via SUBCUTANEOUS

## 2022-10-20 NOTE — Addendum Note (Signed)
Addended by: Devoria Glassing on: 10/20/2022 03:10 PM   Modules accepted: Orders

## 2022-11-08 ENCOUNTER — Other Ambulatory Visit: Payer: Self-pay

## 2022-11-15 ENCOUNTER — Ambulatory Visit: Payer: Medicaid Other

## 2022-11-21 ENCOUNTER — Ambulatory Visit (INDEPENDENT_AMBULATORY_CARE_PROVIDER_SITE_OTHER): Payer: Medicaid Other

## 2022-11-21 DIAGNOSIS — L501 Idiopathic urticaria: Secondary | ICD-10-CM | POA: Diagnosis not present

## 2022-12-01 ENCOUNTER — Other Ambulatory Visit (HOSPITAL_COMMUNITY): Payer: Self-pay

## 2022-12-08 ENCOUNTER — Other Ambulatory Visit (HOSPITAL_COMMUNITY): Payer: Self-pay

## 2022-12-09 ENCOUNTER — Other Ambulatory Visit: Payer: Self-pay

## 2022-12-09 ENCOUNTER — Other Ambulatory Visit (HOSPITAL_COMMUNITY): Payer: Self-pay

## 2022-12-15 ENCOUNTER — Other Ambulatory Visit: Payer: Self-pay

## 2022-12-19 ENCOUNTER — Ambulatory Visit (INDEPENDENT_AMBULATORY_CARE_PROVIDER_SITE_OTHER): Payer: Medicaid Other

## 2022-12-19 DIAGNOSIS — L501 Idiopathic urticaria: Secondary | ICD-10-CM | POA: Diagnosis not present

## 2022-12-28 ENCOUNTER — Other Ambulatory Visit (HOSPITAL_COMMUNITY): Payer: Self-pay

## 2023-01-04 ENCOUNTER — Other Ambulatory Visit (HOSPITAL_COMMUNITY): Payer: Self-pay

## 2023-01-06 ENCOUNTER — Other Ambulatory Visit (HOSPITAL_COMMUNITY): Payer: Self-pay

## 2023-01-09 ENCOUNTER — Other Ambulatory Visit (HOSPITAL_COMMUNITY): Payer: Self-pay

## 2023-01-11 ENCOUNTER — Other Ambulatory Visit (HOSPITAL_COMMUNITY): Payer: Self-pay

## 2023-01-16 ENCOUNTER — Ambulatory Visit: Payer: Medicaid Other
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
## 2023-01-30 ENCOUNTER — Ambulatory Visit: Payer: Medicaid Other

## 2023-01-30 ENCOUNTER — Other Ambulatory Visit (HOSPITAL_COMMUNITY): Payer: Self-pay | Admitting: Pharmacy Technician

## 2023-01-30 ENCOUNTER — Other Ambulatory Visit (HOSPITAL_COMMUNITY): Payer: Self-pay

## 2023-01-30 NOTE — Progress Notes (Signed)
Specialty Pharmacy Refill Coordination Note  Joe Obrien is a 22 y.o. male contacted today regarding refills of specialty medication(s) Omalizumab   Patient requested Courier to Provider Office   Delivery date: 01/31/23   Verified address: [facility] [address]   Medication will be filled on 01/30/23.

## 2023-02-01 ENCOUNTER — Ambulatory Visit: Payer: Medicaid Other

## 2023-02-02 ENCOUNTER — Other Ambulatory Visit: Payer: Self-pay

## 2023-02-02 ENCOUNTER — Encounter: Payer: Self-pay | Admitting: Internal Medicine

## 2023-02-02 ENCOUNTER — Encounter (INDEPENDENT_AMBULATORY_CARE_PROVIDER_SITE_OTHER): Payer: Medicaid Other

## 2023-02-02 ENCOUNTER — Ambulatory Visit: Payer: Medicaid Other | Admitting: Internal Medicine

## 2023-02-02 VITALS — BP 126/68 | HR 75 | Temp 98.4°F | Resp 18 | Ht 72.0 in | Wt 127.9 lb

## 2023-02-02 DIAGNOSIS — L501 Idiopathic urticaria: Secondary | ICD-10-CM | POA: Diagnosis not present

## 2023-02-02 DIAGNOSIS — L02415 Cutaneous abscess of right lower limb: Secondary | ICD-10-CM | POA: Diagnosis not present

## 2023-02-02 MED ORDER — CLINDAMYCIN HCL 150 MG PO CAPS: 300.0000 mg | ORAL_CAPSULE | Freq: Three times a day (TID) | ORAL | 0 refills | Status: AC

## 2023-02-02 NOTE — Patient Instructions (Addendum)
Cutaneous Abscess Noticed 2-3 days ago, possibly secondary to a bite. No prior history of abscesses. Noted to be painful but tolerable. -Start Clindamycin 3 times a day for 7 days. -Apply warm compresses to the area and attempt gentle pressure to facilitate drainage. -If condition worsens or does not improve, seek immediate medical attention at an emergency room or urgent care for possible incision and drainage.  Chronic Urticaria Patient on Xolair for management. -Continue Xolair as prescribed.   Discussed establishing care with a primary care physician.    Follow up : as previously scheduled for asthma and chronic urticaria management It was a pleasure meeting you in clinic today! Thank you for allowing me to participate in your care.  Tonny Bollman, MD [facility]

## 2023-02-02 NOTE — Progress Notes (Signed)
   FOLLOW UP Date of Service/Encounter:  02/02/23  Subjective:  Joe Obrien (DOB: [birth year]) is a 22 y.o. male who [facility] in re-evaluation of the following: Acute visit for spider bite/bug bite History obtained from: chart review and patient.  For Review, LV was on 02/18/21  with Nehemiah Settle, FNP seen for routine follow-up for moderate persistent asthma, idiopathic urticaria, allergic rhinitis and allergic conjunctivitis .  Today presents for follow-up. Discussed the use of AI scribe software for clinical note transcription with the patient, who gave verbal consent to proceed.  History of Present Illness   The patient presented with a raised bump on his leg, first noticed approximately two to three days prior upon waking. The patient speculated that the bump might be a bite mark. The bump was described as painful, but not itchy or burning. The patient denied any previous history of similar lesions or abscesses.  he bump was described as hard and was suspected to be an abscess, possibly resulting from an infected bite. The patient was not under the care of a primary care physician at the time of the consultation. The patient also reported receiving Xolair injections in clinic for the management of hives and is due for one today.        All medications reviewed by clinical staff and updated in chart. No new pertinent medical or surgical history except as noted in HPI.  ROS: All others negative except as noted per HPI.   Objective:  BP 126/68 (BP Location: Left Arm, Patient Position: Sitting, Cuff Size: Normal)   Pulse 75   Temp 98.4 F (36.9 C) (Temporal)   Resp 18   Ht 6' (1.829 m)   Wt 127 lb 14.4 oz (58 kg)   SpO2 99%   BMI 17.35 kg/m  Body mass index is 17.35 kg/m. Physical Exam: General Appearance:  Alert, cooperative, no distress, appears stated age  Head:  Normocephalic, without obvious abnormality, atraumatic  Eyes:   Conjunctiva clear, EOM's intact  Nose: Nares normal, no visible rhinorrhea  Throat: Lips, tongue normal; teeth and gums normal, moist mucus membranes  Neck: Supple, symmetrical  Lungs:   Respirations unlabored, no coughing  Heart:  , Appears well perfused  Extremities: No edema  Skin: Red raised enlarged papule with oozing on right upper thigh with opening, warm, fluctuant and tender to touch  Neurologic: No gross deficits   Labs:  Lab Orders  No laboratory test(s) ordered today    Assessment/Plan   Assessment and Plan    Cutaneous Abscess Noticed 2-3 days ago, possibly secondary to a bite. No prior history of abscesses. Noted to be painful but tolerable. -Start Clindamycin 3 times a day for 10 days. -Apply warm compresses to the area and attempt gentle pressure to facilitate drainage. -If condition worsens or does not improve, seek immediate medical attention at an emergency room or urgent care for possible incision and drainage.  Chronic Urticaria Patient on Xolair for management. -Continue Xolair as prescribed.   Discussed establishing care with a primary care physician.    Follow up : as previously scheduled for asthma and chronic urticaria management It was a pleasure meeting you in clinic today! Thank you for allowing me to participate in your care.  Tonny Bollman, MD  [facility]

## 2023-02-16 NOTE — Progress Notes (Signed)
This encounter was created in error - please disregard.

## 2023-02-24 ENCOUNTER — Other Ambulatory Visit (HOSPITAL_COMMUNITY): Payer: Self-pay

## 2023-02-28 ENCOUNTER — Ambulatory Visit: Payer: Medicaid Other

## 2023-03-09 ENCOUNTER — Other Ambulatory Visit: Payer: Self-pay

## 2023-03-09 NOTE — Progress Notes (Signed)
Specialty Pharmacy Refill Coordination Note  Joe Obrien is a 22 y.o. male contacted today regarding refills of specialty medication(s) Omalizumab   Patient requested Courier to Provider Office   Delivery date: 03/13/23   Verified address: [facility] [address]   Medication will be filled on 03/10/23.

## 2023-03-10 ENCOUNTER — Other Ambulatory Visit: Payer: Self-pay

## 2023-03-10 ENCOUNTER — Other Ambulatory Visit (HOSPITAL_COMMUNITY): Payer: Self-pay

## 2023-03-13 ENCOUNTER — Other Ambulatory Visit (HOSPITAL_COMMUNITY): Payer: Self-pay

## 2023-03-14 ENCOUNTER — Other Ambulatory Visit (HOSPITAL_COMMUNITY): Payer: Self-pay

## 2023-03-14 ENCOUNTER — Ambulatory Visit: Payer: Medicaid Other

## 2023-03-15 ENCOUNTER — Other Ambulatory Visit: Payer: Self-pay

## 2023-03-15 ENCOUNTER — Other Ambulatory Visit (HOSPITAL_COMMUNITY): Payer: Self-pay

## 2023-03-17 ENCOUNTER — Other Ambulatory Visit (HOSPITAL_COMMUNITY): Payer: Self-pay

## 2023-03-20 ENCOUNTER — Other Ambulatory Visit: Payer: Self-pay

## 2023-03-20 ENCOUNTER — Other Ambulatory Visit: Payer: Self-pay | Admitting: *Deleted

## 2023-03-20 ENCOUNTER — Ambulatory Visit: Payer: Medicaid Other

## 2023-03-20 MED ORDER — XOLAIR 150 MG ~~LOC~~ SOLR
300.0000 mg | SUBCUTANEOUS | 11 refills | Status: AC
  Filled 2023-03-20 (×2): qty 2, 28d supply, fill #0
  Filled 2023-05-10: qty 2, 28d supply, fill #1

## 2023-03-20 NOTE — Progress Notes (Signed)
03/20/23: The prior authorization for Xolair has been approved for injection. Will courier to MD office on 03/27/23. Patient will call to reschedule appointment.

## 2023-03-24 ENCOUNTER — Other Ambulatory Visit: Payer: Self-pay

## 2023-03-30 ENCOUNTER — Other Ambulatory Visit: Payer: Self-pay

## 2023-04-10 ENCOUNTER — Other Ambulatory Visit: Payer: Self-pay

## 2023-04-13 ENCOUNTER — Other Ambulatory Visit: Payer: Self-pay

## 2023-04-17 ENCOUNTER — Other Ambulatory Visit: Payer: Self-pay

## 2023-04-24 ENCOUNTER — Ambulatory Visit: Payer: Medicaid Other

## 2023-04-25 ENCOUNTER — Ambulatory Visit (INDEPENDENT_AMBULATORY_CARE_PROVIDER_SITE_OTHER): Payer: Medicaid Other

## 2023-04-25 DIAGNOSIS — J309 Allergic rhinitis, unspecified: Secondary | ICD-10-CM

## 2023-05-10 ENCOUNTER — Other Ambulatory Visit: Payer: Self-pay

## 2023-05-10 NOTE — Progress Notes (Signed)
 Specialty Pharmacy Ongoing Clinical Assessment Note  Joe Obrien is a 23 y.o. male who is being followed by the specialty pharmacy service for RxSp Allergy   Patient's specialty medication(s) reviewed today: Omalizumab  (Xolair )   Missed doses in the last 4 weeks: 0   Patient/Caregiver did not have any additional questions or concerns.   Therapeutic benefit summary: Patient is achieving benefit   Adverse events/side effects summary: No adverse events/side effects   Patient's therapy is appropriate to: Continue    Goals Addressed             This Visit's Progress    Reduce signs and symptoms       Patient is on track. Patient will maintain adherence         Follow up:  6 months  Josiah Nieto M Jossiah Smoak Specialty Pharmacist

## 2023-05-10 NOTE — Progress Notes (Signed)
 Specialty Pharmacy Refill Coordination Note  Joe Obrien is a 23 y.o. male contacted today regarding refills of specialty medication(s) Omalizumab  (Xolair )   Patient requested Courier to Provider Office   Delivery date: 05/22/23   Verified address: [facility] [address]   Medication will be filled on 05/19/23.   Ran test claim, shows coverage terminated nothing on eligibility search and patient said he was not aware of insurance change. He will check on it and call us  back, messaged Juliana also to inform office.

## 2023-05-19 ENCOUNTER — Other Ambulatory Visit: Payer: Self-pay

## 2023-05-19 NOTE — Progress Notes (Signed)
05/19/23 CMA: Xolair  Per Candise Bowens- Ran test claim coverage terminated nothing on eligibility search and patient said he was not aware of insurance change. he will check on it and call us back, messaged juliana also to inform office. Nothing populates via eligibility as of today's date 01/24.

## 2023-05-23 ENCOUNTER — Ambulatory Visit: Payer: Medicaid Other

## 2023-05-23 ENCOUNTER — Other Ambulatory Visit: Payer: Self-pay

## 2023-05-24 ENCOUNTER — Other Ambulatory Visit: Payer: Self-pay

## 2023-05-29 ENCOUNTER — Other Ambulatory Visit: Payer: Self-pay

## 2023-05-31 ENCOUNTER — Other Ambulatory Visit: Payer: Self-pay

## 2023-06-01 ENCOUNTER — Other Ambulatory Visit: Payer: Self-pay

## 2023-06-02 ENCOUNTER — Other Ambulatory Visit: Payer: Self-pay

## 2023-06-02 NOTE — Progress Notes (Signed)
 No coverage in eligibility check. 1/28 appointment was cancelled. Left voice mail to follow up. Prescription will be profiled until coverage is provided.

## 2023-06-05 ENCOUNTER — Other Ambulatory Visit (HOSPITAL_COMMUNITY): Payer: Self-pay

## 2023-06-08 ENCOUNTER — Other Ambulatory Visit: Payer: Self-pay

## 2023-06-12 ENCOUNTER — Other Ambulatory Visit: Payer: Self-pay

## 2023-06-14 ENCOUNTER — Other Ambulatory Visit: Payer: Self-pay

## 2023-07-07 ENCOUNTER — Other Ambulatory Visit: Payer: Self-pay

## 2023-07-10 ENCOUNTER — Other Ambulatory Visit: Payer: Self-pay

## 2023-09-21 ENCOUNTER — Other Ambulatory Visit: Payer: Self-pay

## 2023-09-21 NOTE — Progress Notes (Signed)
 Disenrolled - Xolair  last filled 11.29.24 (181 days ago) patient needed to resolve insurance issues with medicaid. Call center informed patient several times that they needed to call their insurance plan but patient never followed up with call center.

## 2024-01-03 ENCOUNTER — Telehealth: Payer: Self-pay | Admitting: Family Medicine

## 2024-01-03 NOTE — Telephone Encounter (Signed)
 Called patient regarding Xolair  reapproval. Patient is trying to get his insurance fixed at the moment. He will give us  a call back once everything is good to go because he does want to continue therapy. I did inform him his authorization is set to expire 10/18.
